# Patient Record
Sex: Male | Born: 1970 | Race: White | Hispanic: No | Marital: Married | Smoking: Current every day smoker
Health system: Southern US, Community
[De-identification: ages and names within clinical notes are randomized; demographics above are authoritative.]

## PROBLEM LIST (undated history)

## (undated) HISTORY — PX: BACK SURGERY: SHX140

---

## 2003-08-20 ENCOUNTER — Emergency Department (HOSPITAL_COMMUNITY): Admission: EM | Admit: 2003-08-20 | Discharge: 2003-08-20 | Payer: Self-pay | Admitting: Emergency Medicine

## 2012-08-14 ENCOUNTER — Other Ambulatory Visit: Payer: Self-pay | Admitting: Family Medicine

## 2012-08-14 DIAGNOSIS — M25512 Pain in left shoulder: Secondary | ICD-10-CM

## 2012-08-19 ENCOUNTER — Ambulatory Visit
Admission: RE | Admit: 2012-08-19 | Discharge: 2012-08-19 | Disposition: A | Discharge status: Home / Self Care | Payer: Worker's Compensation | Source: Ambulatory Visit | Attending: Family Medicine | Admitting: Family Medicine

## 2012-08-19 DIAGNOSIS — M25512 Pain in left shoulder: Secondary | ICD-10-CM

## 2012-09-15 ENCOUNTER — Other Ambulatory Visit: Payer: Self-pay | Admitting: *Deleted

## 2012-09-15 ENCOUNTER — Other Ambulatory Visit: Payer: Self-pay | Admitting: Orthopedic Surgery

## 2012-09-15 DIAGNOSIS — M25512 Pain in left shoulder: Secondary | ICD-10-CM

## 2012-09-22 ENCOUNTER — Ambulatory Visit
Admission: RE | Admit: 2012-09-22 | Discharge: 2012-09-22 | Disposition: A | Discharge status: Home / Self Care | Payer: Worker's Compensation | Source: Ambulatory Visit | Attending: *Deleted | Admitting: *Deleted

## 2012-09-22 DIAGNOSIS — M25512 Pain in left shoulder: Secondary | ICD-10-CM

## 2012-09-22 MED ORDER — IOHEXOL 180 MG/ML  SOLN
15.0000 mL | Freq: Once | INTRAMUSCULAR | Status: AC | PRN
  Administered 2012-09-22: 13 mL via INTRA_ARTICULAR

## 2015-06-05 ENCOUNTER — Encounter (HOSPITAL_COMMUNITY): Payer: Self-pay | Admitting: *Deleted

## 2015-06-05 ENCOUNTER — Emergency Department (HOSPITAL_COMMUNITY)
Admission: EM | Admit: 2015-06-05 | Discharge: 2015-06-05 | Disposition: A | Discharge status: Home / Self Care | Payer: Worker's Compensation | Attending: Emergency Medicine | Admitting: Emergency Medicine

## 2015-06-05 ENCOUNTER — Emergency Department (HOSPITAL_COMMUNITY): Payer: Worker's Compensation

## 2015-06-05 DIAGNOSIS — M542 Cervicalgia: Secondary | ICD-10-CM | POA: Diagnosis present

## 2015-06-05 DIAGNOSIS — M5020 Other cervical disc displacement, unspecified cervical region: Secondary | ICD-10-CM | POA: Diagnosis not present

## 2015-06-05 DIAGNOSIS — F17210 Nicotine dependence, cigarettes, uncomplicated: Secondary | ICD-10-CM | POA: Insufficient documentation

## 2015-06-05 MED ORDER — OXYCODONE-ACETAMINOPHEN 5-325 MG PO TABS
ORAL_TABLET | ORAL | Status: AC
  Filled 2015-06-05: qty 1

## 2015-06-05 MED ORDER — OXYCODONE-ACETAMINOPHEN 5-325 MG PO TABS: 2.0000 | ORAL_TABLET | ORAL | Status: AC | PRN

## 2015-06-05 MED ORDER — HYDROMORPHONE HCL 1 MG/ML IJ SOLN
1.0000 mg | Freq: Once | INTRAMUSCULAR | Status: AC
  Administered 2015-06-05: 1 mg via INTRAMUSCULAR
  Filled 2015-06-05: qty 1

## 2015-06-05 MED ORDER — OXYCODONE-ACETAMINOPHEN 5-325 MG PO TABS
1.0000 | ORAL_TABLET | Freq: Once | ORAL | Status: AC
  Administered 2015-06-05: 1 via ORAL
  Filled 2015-06-05: qty 1

## 2015-06-05 MED ORDER — OXYCODONE-ACETAMINOPHEN 5-325 MG PO TABS
1.0000 | ORAL_TABLET | Freq: Once | ORAL | Status: AC
  Administered 2015-06-05: 1 via ORAL

## 2015-06-05 NOTE — Discharge Instructions (Signed)
Herniated Disk With Rehab Between each vertebrae of the spine exists a disk. These disks contain a jelly-like material that helps cushion the spinal column. Occasionally, damage to the supportive ligaments of the vertebrae causes a disk to shift from its normal alignment and place pressure on surrounding structures, such as the spinal cord. This is called a herniated (ruptured) disk. SYMPTOMS   Pain in the back, that often affects one side.  Pain that gets worse with movement, sneezing, coughing, or straining.  Muscle spasms in the back.  Pain, numbness, or weakness affecting one arm or leg (depending on whether injury is in the neck or low back).  Muscle loss (if the condition has become chronic).  Loss of stool (bowel) or urine (bladder) function. CAUSES  Herniated disks result when a disk becomes weak. The disk eventually ruptures and places pressure on the spinal cord. Herniated disks may occur from sudden injury (acute trauma) such as heavy labor, or from ongoing (chronic) stress, such as obesity.  RISK INCREASES WITH:  Sports that involve downward or twisting pressure on the neck or spine (football, weightlifting, horseback riding competition, bowling, tennis, jogging, track, racquetball, gymnastics).  Poor strength and flexibility.  Failure to warm up properly before activity.  Family history of low back pain or disk disorders.  Previous back surgery (especially fusion).  Preexisting forward displacement of a vertebra (spondylolisthesis).  Poor technique when lifting.  Prolonged sitting, especially with poor posture. PREVENTION  Learn and use proper technique when sitting or lifting.  Warm up and stretch properly before activity.  Maintain physical fitness:  Strength, flexibility, and endurance.  Cardiovascular fitness.  Maintain a healthy body weight.  If previously injured, avoid any intense physical activity that requires twisting of the body under  uncontrollable conditions. PROGNOSIS  If treated properly, herniated disks are usually curable within 6 weeks. Sometimes, surgery is required.  RELATED COMPLICATIONS   Permanent numbness, weakness, or paralysis and muscle loss.  Chronic back pain.  Loss of bowel or bladder function.  Decreased sexual function.  Risks of surgery: infection, bleeding, injury to nerves (persistent or increased numbness, weakness, or paralysis), persistent back pain, and spinal headache. TREATMENT Treatment first involves resting from any aggravating activities and the use of ice and medicine to reduce pain and inflammation. As muscle spasms begin to decrease, it is important to perform strengthening and stretching exercises of the back muscles. These will help teach and reinforce proper body posture. These exercises may be performed at home, or with a therapist. A therapist may complete a further evaluation and recommend additional treatments, such as ultrasound, traction (for herniated disks of the neck), a cervical collar (for herniated disks of the neck), or a corset or back brace (for herniated disks of the low back). Prolonged rest may do more harm than good. Your therapist will teach you proper techniques for performing simple activities, such as lifting an object off the floor or using proper posture while sitting. At night, it is advised that you sleep on your back, on a firm mattress, and place a pillow under your knees. Your caregiver may recommend oral steroids or an injection of corticosteroids in the space around the spinal cord (epidural space) in order to reduce pain and inflammation. For severe cases, surgery is recommended. MEDICATION   If pain medicine is needed, nonsteroidal anti-inflammatory medicines (aspirin and ibuprofen), or other minor pain relievers (acetaminophen), are often advised.  Do not take pain medicine for 7 days before surgery.  Prescription pain relievers  may be given if your  caregiver thinks they are needed. Use only as directed and only as much as you need.  Ointments applied to the skin may be helpful.  Corticosteroid injections may be given. These injections should be reserved for the most serious cases, as they can only be given a certain number of times.  Oral steroids may be given to reduce inflammation, although not usually for severe (acute) injuries. HEAT AND COLD  Cold treatment (icing) relieves pain and reduces inflammation. Cold treatment should be applied for 10 to 15 minutes every 2 to 3 hours, and immediately after activity that aggravates your symptoms. Use ice packs or an ice massage.  Heat treatment may be used before performing stretching and strengthening activities prescribed by your caregiver, physical therapist, or athletic trainer. Use a heat pack or a warm water soak. SEEK MEDICAL CARE IF:   Symptoms get worse or do not improve in 2 to 4 weeks, despite treatment.  You develop loss of bowel or bladder function.  New, unexplained symptoms develop. (Drugs used in treatment may produce side effects.) EXERCISES  RANGE OF MOTION (ROM) AND STRETCHING EXERCISES - Herniated Disk (Ruptured Disk) Most people with low back pain will find that their symptoms get worse with excessive bending forward (flexion) or arching at the low back (extension). The exercises that will help resolve your symptoms will focus on the opposite motion. Your physician, physical therapist or athletic trainer will help you determine which exercises will be most helpful to resolve your low back pain. Do not complete any exercises without first consulting with your caregiver. Discontinue any exercises that make your symptoms worse, until you speak to your caregiver. If you have pain, numbness or tingling that travels down into your buttocks, leg or foot, the goal of this therapy is for these symptoms to move closer to your back and to eventually go away. Sometimes, these leg  symptoms will get better, but your low back pain may get worse. This is typically an indication of progress in your rehabilitation. Be sure to be very alert to any changes in your symptoms and to the activities you have done in the 24 hours prior to the change. Sharing this information with your caregiver will allow him or her to best treat your condition. These exercises may help you when beginning to rehabilitate your injury. Your symptoms may go away with or without further involvement from your physician, physical therapist or athletic trainer. While completing these exercises, remember:   Restoring tissue flexibility helps normal motion to return to the joints. This allows healthier, less painful movement and activity.  An effective stretch should be held for at least 30 seconds.  A stretch should never be painful. You should only feel a gentle lengthening or release in the stretched tissue. FLEXION RANGE OF MOTION AND STRETCHING EXERCISES: STRETCH - Flexion, Single Knee to Chest  Lie on a firm bed or floor, with both legs extended in front of you.  Keeping one leg in contact with the floor, bring your opposite knee to your chest. Hold your leg in place by either grabbing behind your thigh or at your knee.  Pull until you feel a gentle stretch in your low back. Hold for __________ seconds.  Slowly release your grasp and repeat the exercise with the opposite side. Repeat __________ times. Complete this exercise __________ times per day.  STRETCH - Flexion, Double Knee to Chest   Lie on a firm bed or floor, with both  legs extended in front of you.  Keeping one leg in contact with the floor, bring your opposite knee to your chest.  Tense your stomach muscles to support your back and then lift your other knee to your chest. Hold your legs in place by either grabbing behind your thighs or at your knees.  Pull both knees toward your chest until you feel a gentle stretch in your low back.  Hold for __________ seconds.  Tense your stomach muscles and slowly return one leg at a time to the floor. Repeat __________ times. Complete this exercise __________ times per day.  STRETCH - Low Trunk Rotation  Lie on a firm bed or floor. Keeping your legs in front of you, bend your knees so they are both pointed toward the ceiling and your feet are flat on the floor.  Extend your arms out to the side. This will stabilize your upper body by keeping your shoulders in contact with the floor.  Gently and slowly drop both knees together to one side, until you feel a gentle stretch in your low back. Hold for __________ seconds.  Tense your stomach muscles to support your low back as you bring your knees back to the starting position. Repeat the exercise while dropping both knees to the other side. Repeat __________ times. Complete this exercise __________ times per day  EXTENSION RANGE OF MOTION AND FLEXIBILITY EXERCISES: STRETCH - Extension, Prone on Elbows   Lie on your stomach on the floor. (A bed will be too soft.) Place your palms about shoulder width apart.  Place your elbows under your shoulders. If this is too painful, stack pillows under your chest.  Allow your body to relax so that your hips drop lower and make contact more completely with the floor.  Hold this position for __________ seconds.  Slowly return to lying flat on the floor. Repeat __________ times. Complete this exercise __________ times per day.  RANGE OF MOTION - Extension, Prone Press Ups   Lie on your stomach on the floor. (A bed will be too soft.) Place your palms about shoulder width apart and at the height of your head.  Keeping your back as relaxed as possible, slowly straighten your elbows while keeping your hips on the floor. You may adjust the placement of your hands to maximize your comfort. As you gain motion, your hands will come more underneath your shoulders.  Hold this position for __________  seconds.  Slowly return to lying flat on the floor. Repeat __________ times. Complete this exercise __________ times per day.  RANGE OF MOTION- Quadruped, Neutral Spine   Assume a hands and knees position on a firm surface. Keep your hands under your shoulders and your knees under your hips. You may place padding under your knees for comfort.  Drop your head and point your tail bone toward the ground below you. This will round out your low back like an angry cat. Hold this position for __________ seconds.  Slowly lift your head and release your tail bone so that your back sags into a large arch, like an old horse.  Hold this position for __________ seconds.  Repeat this until you feel limber in your low back.  Now, find your "sweet spot." This will be the most comfortable position somewhere between the two previous positions. This is your neutral spine. Once you have found this position, tense your stomach muscles to support your low back.  Hold this position for __________ seconds. Repeat __________ times.  Complete this exercise __________ times per day.  STRENGTHENING EXERCISES - Herniated Disk (Ruptured Disk) These exercises may help you when beginning to rehabilitate your injury. These exercises should be done near your "sweet spot." This is the neutral, low-back arch, somewhere between fully rounded and fully arched, that is your least painful position. When performed in this safe range of motion, these exercises can be used for people who have either a flexion or extension based injury. These exercises may resolve your symptoms with or without further involvement from your physician, physical therapist or athletic trainer. While completing these exercises, remember:   Muscles can gain both the endurance and the strength needed for everyday activities through controlled exercises.  Complete these exercises as instructed by your physician, physical therapist or athletic trainer. Increase  the resistance and repetitions only as guided.  You may experience muscle soreness or fatigue, but the pain or discomfort you are trying to eliminate should never worsen during these exercises. If this pain does get worse, stop and make sure you are following the directions exactly. If the pain is still present after adjustments, discontinue the exercise until you can discuss the trouble with your clinician. STRENGTHENING - Deep Abdominals, Pelvic Tilt   Lie on a firm bed or floor. Keeping your legs in front of you, bend your knees so they are both pointed toward the ceiling and your feet are flat on the floor.  Tense your lower abdominal muscles to press your low back into the floor. This motion will rotate your pelvis so that your tail bone is scooping upwards rather than pointing at your feet or into the floor.  With a gentle tension and even breathing, hold this position for __________ seconds. Repeat __________ times. Complete this exercise __________ times per day.  STRENGTHENING - Abdominals, Crunches   Lie on a firm bed or floor. Keeping your legs in front of you, bend your knees so they are both pointed toward the ceiling and your feet are flat on the floor. Cross your arms over your chest.  Slightly tip your chin down without bending your neck.  Tense your abdominals and slowly lift your trunk high enough so that your shoulder blades are just off the floor. Lifting higher can put too much stress on the low back and does not further strengthen your abdominal muscles.  With control, return to the starting position. Repeat __________ times. Complete this exercise __________ times per day.  STRENGTHENING - Quadruped, Opposite UE/LE Lift  Assume a hands and knees position on a firm surface. Keep your hands under your shoulders and your knees under your hips. You may place padding under your knees for comfort.  Find your neutral spine and gently tense your abdominal muscles so that you  can maintain this position. Your shoulders and hips should form a rectangle that is parallel with the floor and is not twisted.  Keeping your trunk steady, lift your right hand no higher than your shoulder. Then lift your left leg no higher than your hip. Make sure you are not holding your breath. Hold this position for __________ seconds.  Continuing to keep your abdominal muscles tense and your back steady, slowly return to your starting position. Repeat with the opposite arm and leg. Repeat __________ times. Complete this exercise __________ times per day.  STRENGTHENING - Lower Abdominals, Double Knee Lift  Lie on a firm bed or floor. Keeping your legs in front of you, bend your knees so they are both pointed  toward the ceiling and your feet are flat on the floor.  Tense your abdominal muscles to brace your low back and slowly lift both of your knees until they come over your hips. Be certain not to hold your breath.  Hold for __________ seconds. Using your abdominal muscles, return to the starting position in a slow and controlled manner. Repeat __________ times. Complete this exercise __________ times per day.  POSTURE AND BODY MECHANICS CONSIDERATIONS - Herniated Disc (Ruptured Disk) Keeping correct posture when sitting, standing or completing your activities will reduce the stress put on different body tissues, allowing injured tissues a chance to heal and limiting painful experiences. The following are general guidelines for improved posture. Your physician or physical therapist will provide you with any instructions specific to your needs. While reading these guidelines, remember:  The exercises prescribed by your provider will help you build the flexibility and strength to maintain correct postures.  The correct posture provides the best environment for your joints to work. All of your joints have less wear and tear when properly supported by a spine with good posture. This means you  will experience a healthier, less painful body.  Correct posture must be practiced with all of your activities, especially prolonged sitting and standing. Correct posture is as important when doing repetitive low-stress activities (typing) as it is when doing a single heavy-load activity (lifting). RESTING POSITIONS Consider which positions are most painful for you when choosing a resting position. If you have pain with flexion-based activities (sitting, bending, stooping, squatting), choose a position that allows you to rest in a less flexed posture. You would want to avoid curling into a fetal position on your side. If your pain gets worse with extension-based activities (prolonged standing, working overhead), avoid resting in an extended position such as sleeping on your stomach. Most people will find more comfort when they rest with their spine in a more neutral position, neither too rounded nor too arched. Lying on a non-sagging bed on your side, with a pillow between your knees, or on your back with a pillow under your knees will often provide some relief. Keep in mind, being in any one position for a prolonged period of time, no matter how correct your posture, can still lead to stiffness. PROPER SITTING POSTURE In order to minimize stress and discomfort on your spine, you must sit with correct posture. Sitting with good posture should be effortless for a healthy body. Returning to good posture is a gradual process. Many people can work toward this most comfortably by using various supports until they have the flexibility and strength to maintain this posture on their own. When sitting with proper posture, your ears will fall over your shoulders and your shoulders will fall over your hips. You should use the back of the chair to support your upper back. Your low back will be in a neutral position, just slightly arched. You may place a small pillow or folded towel at the base of your low back for  support.  When working at a desk, create an environment that supports good, upright posture. Without extra support, muscles tire, which leads to excessive strain on joints and other tissues. Keep these recommendations in mind. CHAIR  A chair should be able to slide under your desk when your back makes contact with the back of the chair. This allows you to work closely.  The chair's height should allow your eyes to be level with the upper part of your monitor  and your hands to be slightly lower than your elbows. BODY POSITION  Your feet should make contact with the floor. If this is not possible, use a foot rest.  Keep your ears over your shoulders. This will reduce stress on your neck and low back. INCORRECT SITTING POSTURES  If you are feeling tired and unable to assume a healthy sitting posture, do not slouch or slump. This puts excessive strain on your back tissues, causing more damage and pain. Healthier options include:  Using more support, like a lumbar pillow.  Switching tasks, to something that requires you to be upright or walking.  Talking a brief walk.  Lying down to rest in a neutral-spine position. PROLONGED STANDING WHILE SLIGHTLY LEANING FORWARD  When completing a task that requires you to lean forward while standing in one place for a long time, place either foot up on a stationary 2-4 inch high object, to help maintain the best posture. When both feet are on the ground, the low back tends to lose its slight inward curve. If this curve flattens (or becomes too large), the back and your other joints will experience too much stress, tire more quickly and can cause pain. CORRECT STANDING POSTURES Proper standing posture should be assumed with all daily activities, even if they only take a few moments, like when brushing your teeth. As in sitting, your ears should fall over your shoulders and your shoulders should fall over your hips. You should keep a slight tension in your  abdominal muscles to brace your spine. Your tailbone should point down to the ground, not behind your body, resulting in an over-extended swayback posture.  INCORRECT STANDING POSTURES  Common incorrect standing postures include a forward head, locked knees or an excessive swayback. WALKING Walk with an upright posture. Your ears, shoulders and hips should all line-up. PROLONGED ACTIVITY IN A FLEXED POSITION When completing a task that requires you to bend forward at your waist or lean over a low surface, try finding a way to stabilize 3 out of 4 of your limbs. You can place a hand or elbow on your thigh, or rest a knee on the surface you are reaching across. This will provide you more stability so that your muscles do not tire as quickly. By keeping your knees relaxed, or slightly bent, you will also reduce stress across your low back. CORRECT LIFTING TECHNIQUES DO :   Assume a wide stance. This will provide you more stability and the opportunity to get as close as possible to the object you are lifting.  Tense your abdominals to brace your spine. Then, bend at the knees and hips. Keeping your back locked in a neutral-spine position, lift using your leg muscles. Lift with your legs, keeping your back straight.  Test the weight of unknown objects before attempting to lift them.  Try to keep your elbows down by your sides, in order get the best strength from your shoulders when carrying an object.  Always ask for help when lifting heavy or awkward objects. INCORRECT LIFTING TECHNIQUES DO NOT:   Lock your knees when lifting, even if it is a small object.  Bend and twist. Pivot at your feet or move your feet when needing to change directions.  Assume that you can safely pick up even a paper clip, without proper posture.   This information is not intended to replace advice given to you by your health care provider. Make sure you discuss any questions you have with your  health care provider.    Document Released: 07/01/2005 Document Revised: 07/22/2014 Document Reviewed: 10/13/2008 [employer] Interactive Patient Education 2016 [employer]  Herniated Disk A herniated disk occurs when a disk in your spine bulges out too far. This condition is also called a ruptured disk or slipped disk. Your spine (backbone) is made up of bones called vertebrae. Between each pair of vertebrae is an oval disk with a soft, spongy center that acts as a shock absorber when you move. The spongy center is surrounded by a tough outer ring. When you have a herniated disk, the spongy center of the disk bulges out or ruptures through the outer ring. A herniated disk can press on a nerve between your vertebrae and cause pain. A herniated disk can occur anywhere in your back or neck area, but the lower back is the most common spot. CAUSES  In many cases, a herniated disk occurs just from getting older. As you age, the spongy insides of your disks tend to shrink and dry out. A herniated disk can result from gradual wear and tear. Injury or sudden strain can also cause a herniated disk.  RISK FACTORS Aging is the main risk factor for a herniated disk. Other risk factors include:  Being a man between the ages of 3930 and 4951 years.  Having a job that requires heavy lifting, bending, or twisting.  Having a job that requires long hours of driving.  Not getting enough exercise.  Being overweight.  Smoking. SIGNS AND SYMPTOMS  Signs and symptoms depend on which disk is herniated.  For a herniated disk in the lower back, you may have sharp pain in:  One part of your leg, hip, or buttocks.  The back of your calf.  The top or sole of your foot (sciatica).   For a herniated disk in the neck, you may feel pain:  When you move your neck.  Near or over your shoulder blade.  That moves to your upper arm, forearm, or fingers.   You may also have muscle weakness. It may be hard to:  Lift your leg or  arm.  Stand on your toes.  Squeeze tightly with one of your hands.  Other symptoms can include:  Numbness or tingling in the affected areas of your body.  Loss of bladder or bowel control. This is a rare but serious sign of a severe herniated disk in the lower back. DIAGNOSIS  Your health care provider will do a physical exam. During this exam, you may have to move certain body parts or assume various positions. For example, your health care provider may do the straight-leg test. This is a good way to test for a herniated disk in your lower back. In this test, the health care provider lifts your leg while you lie on your back. This is to see if you feel pain down your leg. Your health care provider will also check for numbness or loss of feeling.  Your health care provider will also check your:  Reflexes.  Muscle strength.  Posture.  Other tests may be done to help in making a diagnosis. These may include:  An X-ray of the spine to rule out other causes of back pain.   Other imaging studies, such as an MRI or CT scan. This is to check whether the herniated disk is pressing on your spinal canal.  Electromyography (EMG). This test checks the nerves that control muscles. It is sometimes used to identify the specific area of  nerve involvement.  TREATMENT  In many cases, herniated disk symptoms go away over a period of days or weeks. You will most likely be free of symptoms in 3-4 months. Treatment may include the following:  The initial treatment for a herniated disk is ashort period of rest.  Bed rest is often limited to 1 or 2 days. Resting for too long delays recovery.  If you have a herniated disk in your lower back, you should avoid sitting as much as possible because sitting increases pressure on the disk.  Medicines. These may include:   Nonsteroidal anti-inflammatory drugs (NSAIDs).  Muscle relaxants for back spasms.  Narcotic pain medicine if your pain is very  bad.   Steroid injections. You may need these along the involved nerve root to help control pain. The steroid is injected in the area of the herniated disk. It helps by reducing swelling around the disk.  Physical therapy. This may include exercises to strengthen the muscles that help support your spine.   You may need surgery if other treatments do not work.  HOME CARE INSTRUCTIONS Follow all your health care provider's instructions. These may include:  Take all medicines as directed by your health care provider.  Rest for 2 days and then start moving.  Do not sit or stand for long periods of time.  Maintain good posture when sitting and standing.  Avoid movements that cause pain, such as bending or lifting.  When you are able to start lifting things again:  [place] with your knees.  Keep your back straight.  Hold heavy objects close to your body.  If you are overweight, ask your health care provider to help you start a weight-loss program.  When you are able to start exercising, ask your health care provider how much and what type of exercise is best for you.  Work with a physical therapist on stretching and strengthening exercises for your back.  Do not wear high-heeled shoes.  Do not sleep on your belly.  Do not smoke.  Keep all follow-up visits as directed by your health care provider. SEEK MEDICAL CARE IF:  You have back or neck pain that is not getting better after 4 weeks.  You have very bad pain in your back or neck.  You develop numbness, tingling, or weakness along with pain. SEEK IMMEDIATE MEDICAL CARE IF:   You have numbness, tingling, or weakness that makes you unable to use your arms or legs.  You lose control of your bladder or bowels.  You have dizziness or fainting.  You have shortness of breath.  MAKE SURE YOU:   Understand these instructions.  Will watch your condition.  Will get help right away if you are not doing well or get  worse.   This information is not intended to replace advice given to you by your health care provider. Make sure you discuss any questions you have with your health care provider.   Keep appointment with your neurosurgeon for tomorrow at 10:45 AM. That as needed for pain. Do not drive while taking Percocet. Return to the emergency department if you ask parents worsening of your symptoms, increased pain, blurry vision, increased weakness in your left arm, discoloration of your arm.

## 2015-06-05 NOTE — ED Notes (Addendum)
Pt up to restroom. Pt able to ambulate independently, gait steady and even.  Pt able to toilet independently.

## 2015-06-05 NOTE — ED Notes (Signed)
Pt back from MRI. Placed back on the monitor.

## 2015-06-05 NOTE — ED Notes (Signed)
Pt reports hx of cervical fusion. Was picking up object last night and felt "something pop in his neck" now has severe pain to upper back, neck and reports numbness to left arm and right hand.

## 2015-06-05 NOTE — ED Notes (Signed)
Pt able to ambulate independently to wheelchair 

## 2015-06-05 NOTE — ED Notes (Signed)
Spoke with PA.  Okay'd pt to eat and drink.  [place] and beverages provided to pt and his guests.

## 2015-06-07 ENCOUNTER — Encounter (HOSPITAL_COMMUNITY): Payer: Self-pay | Admitting: Emergency Medicine

## 2015-06-07 ENCOUNTER — Emergency Department (HOSPITAL_COMMUNITY)
Admission: EM | Admit: 2015-06-07 | Discharge: 2015-06-07 | Disposition: A | Discharge status: Home / Self Care | Payer: Worker's Compensation | Attending: Emergency Medicine | Admitting: Emergency Medicine

## 2015-06-07 DIAGNOSIS — F17210 Nicotine dependence, cigarettes, uncomplicated: Secondary | ICD-10-CM | POA: Insufficient documentation

## 2015-06-07 DIAGNOSIS — M5020 Other cervical disc displacement, unspecified cervical region: Secondary | ICD-10-CM | POA: Diagnosis not present

## 2015-06-07 DIAGNOSIS — M542 Cervicalgia: Secondary | ICD-10-CM | POA: Insufficient documentation

## 2015-06-07 MED ORDER — MORPHINE SULFATE (PF) 4 MG/ML IV SOLN
4.0000 mg | INTRAVENOUS | Status: DC | PRN
  Administered 2015-06-07: 4 mg via INTRAVENOUS
  Filled 2015-06-07: qty 1

## 2015-06-07 MED ORDER — DEXAMETHASONE SODIUM PHOSPHATE 10 MG/ML IJ SOLN
10.0000 mg | Freq: Once | INTRAMUSCULAR | Status: AC
  Administered 2015-06-07: 10 mg via INTRAVENOUS
  Filled 2015-06-07: qty 1

## 2015-06-07 MED ORDER — LORAZEPAM 2 MG/ML IJ SOLN
1.0000 mg | Freq: Once | INTRAMUSCULAR | Status: AC
  Administered 2015-06-07: 1 mg via INTRAVENOUS
  Filled 2015-06-07: qty 1

## 2015-06-07 MED ORDER — ONDANSETRON 4 MG PO TBDP: 4.0000 mg | ORAL_TABLET | Freq: Three times a day (TID) | ORAL | Status: AC | PRN

## 2015-06-07 MED ORDER — KETOROLAC TROMETHAMINE 30 MG/ML IJ SOLN: 30.0000 mg | Freq: Once | INTRAMUSCULAR | Status: DC

## 2015-06-07 MED ORDER — MORPHINE SULFATE (PF) 4 MG/ML IV SOLN
4.0000 mg | Freq: Once | INTRAVENOUS | Status: AC
  Administered 2015-06-07: 4 mg via INTRAVENOUS
  Filled 2015-06-07: qty 1

## 2015-06-07 MED ORDER — FENTANYL 25 MCG/HR TD PT72
25.0000 ug | MEDICATED_PATCH | Freq: Once | TRANSDERMAL | Status: DC
  Administered 2015-06-07: 25 ug via TRANSDERMAL
  Filled 2015-06-07: qty 1

## 2015-06-07 NOTE — ED Provider Notes (Signed)
CSN: [id number]     Arrival date & time 06/07/15  1538 History   First MD Initiated Contact with Patient 06/07/15 1552     Chief Complaint  Patient presents with  . Neck Pain   (Consider location/radiation/quality/duration/timing/severity/associated sxs/prior Treatment) HPI Comments: He reports sudden onset of neck pain 2 days ago while bending to pick something up. He was seen in the emergency Department that time an MRI showed mild disc bulging. He was treated with pain medicine and referred to his neurosurgeon. He was seen by his neurosurgeon yesterday who discussed his MRI findings and did not think this was a surgical case at this time. He was given steroid Dosepak and pain medications as well as Valium. He reports the pain has continued to get worse today, and he caught his neurosurgeon who advised him to come to the emergency department for pain relief. He started the steroids this morning and took only 2 percocet prior to his arrival. He reports numbness tingling and burning radiating into both arms. He thinks his left arm is weaker than his right.   Patient is a 44 y.o. male presenting with neck pain.  Neck Pain Pain location:  Generalized neck Quality:  Burning and stabbing Pain radiates to:  L shoulder, R shoulder, R scapula, L scapula, L arm, R arm, L forearm, R forearm, R hand and L hand Pain severity:  Severe Pain is:  Same all the time Onset quality:  Sudden Duration:  2 days Timing:  Constant Progression:  Worsening Chronicity:  New Context: lifting a heavy object and recent injury   Context: not fall   Relieved by:  Nothing Worsened by:  Position Ineffective treatments:  Analgesics and muscle relaxants Associated symptoms: tingling and weakness   Associated symptoms: no bladder incontinence, no bowel incontinence, no chest pain, no fever, no headaches, no leg pain, no photophobia, no syncope and no weight loss   Risk factors comment:  Left arm   History reviewed. No  pertinent past medical history. Past Surgical History  Procedure Laterality Date  . Back surgery     No family history on file. Social History  Substance Use Topics  . Smoking status: Current Every Day Smoker    Types: Cigarettes  . Smokeless tobacco: None  . Alcohol Use: No    Review of Systems  Constitutional: Negative for fever, chills, weight loss, activity change, appetite change and fatigue.  Eyes: Negative for photophobia.  Respiratory: Negative.   Cardiovascular: Negative.  Negative for chest pain and syncope.  Gastrointestinal: Negative for bowel incontinence.  Genitourinary: Negative for bladder incontinence.  Musculoskeletal: Positive for neck pain.  Neurological: Positive for tingling and weakness. Negative for headaches.      Allergies  Review of patient's allergies indicates no known allergies.  Home Medications   Prior to Admission medications   Medication Sig Start Date End Date Taking? Authorizing Provider  oxyCODONE-acetaminophen (PERCOCET/ROXICET) 5-325 MG tablet Take 2 tablets by mouth every 4 (four) hours as needed for severe pain. 06/05/15  Yes Samantha Tripp [place], PA-C  ondansetron (ZOFRAN ODT) 4 MG disintegrating tablet Take 1 tablet (4 mg total) by mouth every 8 (eight) hours as needed for nausea or vomiting. 06/07/15   Jamal Collin, MD   BP 143/92 mmHg  Pulse 67  Temp(Src) 97.7 F (36.5 C) (Oral)  SpO2 96% Physical Exam  Constitutional: He is oriented to person, place, and time. He appears well-developed and well-nourished. No distress.  Eyes: Conjunctivae are normal. Pupils are  equal, round, and reactive to light.  Neck:  Tenderness to palpation in paraspinal muscles without midline tenderness. ROM limited by pain.   Cardiovascular: Normal rate, regular rhythm, normal heart sounds and intact distal pulses.   No murmur heard. Pulmonary/Chest: Effort normal and breath sounds normal.  Neurological: He is alert and oriented to person,  place, and time. No cranial nerve deficit.  Reflex Scores:      Tricep reflexes are 1+ on the right side and 1+ on the left side.      Bicep reflexes are 1+ on the right side and 1+ on the left side. Shoulder abduction, elbow flexion and extension, wrist flexion extension , finger abduction & adduction all 5/5 strength. Reports tingling in bilateral arms but no numbness  Nursing note and vitals reviewed.   ED Course  Procedures (including critical care time) Labs Review Labs Reviewed - No data to display  Imaging Review No results found. I have personally reviewed and evaluated these images and lab results as part of my medical decision-making.   EKG Interpretation None      MDM   Final diagnoses:  Neck pain  Cervical disc herniation   44 year old male present with neck pain with radiation to bilateral arms. 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 He was seen in the ED 2 days ago with similar symptoms and MRI that revealed mild disc bulging at C5. He was seen by his neurosurgeon yesterday and was advised that he did not need surgery; he was given steroid dose pack, pain medication and Valium which he was unable to fill until this morning. He called his neurosurgeon who advised him to come to the emergency department for pain control. I Called and discussed his case and MRI with his neurosurgeon who IV pain medications and steroids. His pain was improved with IV medications and he was discharged to follow-up with his neurosurgeon on Monday.   Jamal CollinJames R Deanta Mincey, MD 06/07/2015, 7:01 PM PGY-3, [facility]    Jamal CollinJames R Francine Hannan, MD 06/07/15 1901  Nelva Nayobert Beaton, MD 06/07/15 [phone]

## 2015-06-07 NOTE — ED Provider Notes (Signed)
CSN: [id number]     Arrival date & time 06/05/15  1155 History   First MD Initiated Contact with Patient 06/05/15 1600     Chief Complaint  Patient presents with  . Neck Pain     (Consider location/radiation/quality/duration/timing/severity/associated sxs/prior Treatment) HPI   Ralph Mcdowell is a 44 y.o M with a pmhx of a C5-C6 disc fusion 05/2014 who presents to the ED c/o neck pain. Pt states that last night he was picking up one of his child's toys when he felt sudden onset pain in his neck with associated paresthesias and weakness in his left arm. Pt has taken hydrocodone with no relief. Denies blurry vision, jaw or chest pain, numbness, dizziness, syncope.   History reviewed. No pertinent past medical history. Past Surgical History  Procedure Laterality Date  . Back surgery     History reviewed. No pertinent family history. Social History  Substance Use Topics  . Smoking status: Current Every Day Smoker    Types: Cigarettes  . Smokeless tobacco: None  . Alcohol Use: No    Review of Systems  Neurological: Negative for tremors and headaches.  All other systems reviewed and are negative.     Allergies  Review of patient's allergies indicates no known allergies.  Home Medications   Prior to Admission medications   Medication Sig Start Date End Date Taking? Authorizing Provider  ondansetron (ZOFRAN ODT) 4 MG disintegrating tablet Take 1 tablet (4 mg total) by mouth every 8 (eight) hours as needed for nausea or vomiting. 06/07/15   Jamal CollinJames R Joyner, MD  oxyCODONE-acetaminophen (PERCOCET/ROXICET) 5-325 MG tablet Take 2 tablets by mouth every 4 (four) hours as needed for severe pain. 06/05/15   Nicki Gracy Tripp Shamyah Stantz, PA-C   BP 126/93 mmHg  Pulse 72  Temp(Src) 97.7 F (36.5 C) (Oral)  Resp 18  SpO2 98% Physical Exam  Constitutional: He is oriented to person, place, and time. He appears well-developed and well-nourished. No distress.  HENT:  Head: Normocephalic and  atraumatic.  Mouth/Throat: No oropharyngeal exudate.  Eyes: Conjunctivae and EOM are normal. Pupils are equal, round, and reactive to light. Right eye exhibits no discharge. Left eye exhibits no discharge. No scleral icterus.  Cardiovascular: Normal rate, regular rhythm, normal heart sounds and intact distal pulses.  Exam reveals no gallop and no friction rub.   No murmur heard. Pulmonary/Chest: Effort normal and breath sounds normal. No respiratory distress. He has no wheezes. He has no rales. He exhibits no tenderness.  Abdominal: Soft. He exhibits no distension. There is no tenderness. There is no guarding.  Musculoskeletal: Normal range of motion. He exhibits no edema.  Cervical midline spinal tenderness present  Neurological: He is alert and oriented to person, place, and time. No cranial nerve deficit. He exhibits normal muscle tone.  Grip Strength 3/5 in LUE. Strength 5/5 in all other extremties. No sensory deficits.    Skin: Skin is warm and dry. No rash noted. He is not diaphoretic. No erythema. No pallor.  Psychiatric: He has a normal mood and affect. His behavior is normal.  Nursing note and vitals reviewed.   ED Course  Procedures (including critical care time) Labs Review Labs Reviewed - No data to display  Imaging Review No results found. I have personally reviewed and evaluated these images and lab results as part of my medical decision-making.   EKG Interpretation None      MDM   Final diagnoses:  Cervical disc herniation    44 y.o with hx  of C5-C6 disc fusion presents for acute onset neck pain with associated left arm paresthesias and weakness after picking up something heavy last night. Concern for new disc herniation. Will obtain MRI to evaluate. Pt given pain medication in ED>  MRI reveals new C3-4 mild bulge. Mild narrowing ventral aspect of the thecal sac. This is clinically correlated with pts symptoms. Pt has already scheduled appointment with  neurosurgeon tomorrow at 10:45 am. Will d/c home with pain medications. Feel that pt is stable for discharge with close follow up. Pt is hemodynamically stable. Pain controlled in ED. Return precautions outlined in patient discharge instructions.      Lester Kinsman [place], PA-C 06/07/15 2228  Arby Barrette, MD 06/12/15 [phone]

## 2015-06-07 NOTE — Discharge Instructions (Signed)
Herniated Disk  A herniated disk occurs when a disk in your spine bulges out too far. This condition is also called a ruptured disk or slipped disk. Your spine (backbone) is made up of bones called vertebrae. Between each pair of vertebrae is an oval disk with a soft, spongy center that acts as a shock absorber when you move. The spongy center is surrounded by a tough outer ring.  When you have a herniated disk, the spongy center of the disk bulges out or ruptures through the outer ring. A herniated disk can press on a nerve between your vertebrae and cause pain. A herniated disk can occur anywhere in your back or neck area, but the lower back is the most common spot.  CAUSES   In many cases, a herniated disk occurs just from getting older. As you age, the spongy insides of your disks tend to shrink and dry out. A herniated disk can result from gradual wear and tear. Injury or sudden strain can also cause a herniated disk.   RISK FACTORS  Aging is the main risk factor for a herniated disk. Other risk factors include:   Being a man between the ages of 30 and 50 years.   Having a job that requires heavy lifting, bending, or twisting.   Having a job that requires long hours of driving.   Not getting enough exercise.   Being overweight.   Smoking.  SIGNS AND SYMPTOMS   Signs and symptoms depend on which disk is herniated.   For a herniated disk in the lower back, you may have sharp pain in:    One part of your leg, hip, or buttocks.    The back of your calf.    The top or sole of your foot (sciatica).    For a herniated disk in the neck, you may feel pain:    When you move your neck.    Near or over your shoulder blade.    That moves to your upper arm, forearm, or fingers.    You may also have muscle weakness. It may be hard to:    Lift your leg or arm.    Stand on your toes.    Squeeze tightly with one of your hands.   Other symptoms can include:    Numbness or tingling in the affected areas of your body.     Loss of bladder or bowel control. This is a rare but serious sign of a severe herniated disk in the lower back.  DIAGNOSIS   Your health care provider will do a physical exam. During this exam, you may have to move certain body parts or assume various positions. For example, your health care provider may do the straight-leg test. This is a good way to test for a herniated disk in your lower back. In this test, the health care provider lifts your leg while you lie on your back. This is to see if you feel pain down your leg. Your health care provider will also check for numbness or loss of feeling.   Your health care provider will also check your:   Reflexes.   Muscle strength.   Posture.   Other tests may be done to help in making a diagnosis. These may include:   An X-ray of the spine to rule out other causes of back pain.    Other imaging studies, such as an MRI or CT scan. This is to check whether the herniated disk is   pressing on your spinal canal.   Electromyography (EMG). This test checks the nerves that control muscles. It is sometimes used to identify the specific area of nerve involvement.   TREATMENT   In many cases, herniated disk symptoms go away over a period of days or weeks. You will most likely be free of symptoms in 3-4 months. Treatment may include the following:   The initial treatment for a herniated disk is ashort period of rest.    Bed rest is often limited to 1 or 2 days. Resting for too long delays recovery.    If you have a herniated disk in your lower back, you should avoid sitting as much as possible because sitting increases pressure on the disk.   Medicines. These may include:     Nonsteroidal anti-inflammatory drugs (NSAIDs).    Muscle relaxants for back spasms.    Narcotic pain medicine if your pain is very bad.    Steroid injections. You may need these along the involved nerve root to help control pain. The steroid is injected in the area of the herniated disk. It  helps by reducing swelling around the disk.   Physical therapy. This may include exercises to strengthen the muscles that help support your spine.    You may need surgery if other treatments do not work.   HOME CARE INSTRUCTIONS  Follow all your health care provider's instructions. These may include:   Take all medicines as directed by your health care provider.   Rest for 2 days and then start moving.   Do not sit or stand for long periods of time.   Maintain good posture when sitting and standing.   Avoid movements that cause pain, such as bending or lifting.   When you are able to start lifting things again:   Bend with your knees.   Keep your back straight.   Hold heavy objects close to your body.   If you are overweight, ask your health care provider to help you start a weight-loss program.   When you are able to start exercising, ask your health care provider how much and what type of exercise is best for you.   Work with a physical therapist on stretching and strengthening exercises for your back.   Do not wear high-heeled shoes.   Do not sleep on your belly.   Do not smoke.   Keep all follow-up visits as directed by your health care provider.  SEEK MEDICAL CARE IF:   You have back or neck pain that is not getting better after 4 weeks.   You have very bad pain in your back or neck.   You develop numbness, tingling, or weakness along with pain.  SEEK IMMEDIATE MEDICAL CARE IF:    You have numbness, tingling, or weakness that makes you unable to use your arms or legs.   You lose control of your bladder or bowels.   You have dizziness or fainting.   You have shortness of breath.   MAKE SURE YOU:    Understand these instructions.   Will watch your condition.   Will get help right away if you are not doing well or get worse.     This information is not intended to replace advice given to you by your health care provider. Make sure you discuss any questions you have with your health  care provider.     Document Released: 06/28/2000 Document Revised: 07/22/2014 Document Reviewed: 06/04/2013  [employer]vier Interactive Patient Education   2016 [employer]

## 2015-06-07 NOTE — ED Notes (Signed)
Per  [facility] patient was seen Monday for neck pain and was diagnosed with a bulging disc at C3.  Patient states the pain has gradually gotten worse since being seen.  En route patient received 250mcg Fentanyl, 4mg  zofran, and 30mg  toradol.  Patient currently rates pain at 6/10 and is A/Ox4.

## 2016-10-28 ENCOUNTER — Emergency Department (HOSPITAL_COMMUNITY)
Admission: EM | Admit: 2016-10-28 | Discharge: 2016-10-28 | Disposition: A | Discharge status: Home / Self Care | Payer: Commercial Managed Care - PPO | Attending: Emergency Medicine | Admitting: Emergency Medicine

## 2016-10-28 ENCOUNTER — Encounter (HOSPITAL_COMMUNITY): Payer: Self-pay | Admitting: Emergency Medicine

## 2016-10-28 ENCOUNTER — Emergency Department (HOSPITAL_COMMUNITY): Payer: Commercial Managed Care - PPO

## 2016-10-28 DIAGNOSIS — F17210 Nicotine dependence, cigarettes, uncomplicated: Secondary | ICD-10-CM | POA: Diagnosis not present

## 2016-10-28 DIAGNOSIS — R109 Unspecified abdominal pain: Secondary | ICD-10-CM | POA: Diagnosis present

## 2016-10-28 DIAGNOSIS — Z79899 Other long term (current) drug therapy: Secondary | ICD-10-CM | POA: Insufficient documentation

## 2016-10-28 LAB — URINALYSIS, ROUTINE W REFLEX MICROSCOPIC
BILIRUBIN URINE: NEGATIVE
GLUCOSE, UA: NEGATIVE mg/dL
Hgb urine dipstick: NEGATIVE
KETONES UR: NEGATIVE mg/dL
LEUKOCYTES UA: NEGATIVE
NITRITE: NEGATIVE
PROTEIN: NEGATIVE mg/dL
Specific Gravity, Urine: 1.005 (ref 1.005–1.030)
pH: 7 (ref 5.0–8.0)

## 2016-10-28 LAB — CBC WITH DIFFERENTIAL/PLATELET
BASOS ABS: 0.1 10*3/uL (ref 0.0–0.1)
BASOS PCT: 1 %
EOS ABS: 0.4 10*3/uL (ref 0.0–0.7)
Eosinophils Relative: 4 %
HEMATOCRIT: 43.6 % (ref 39.0–52.0)
Hemoglobin: 15.1 g/dL (ref 13.0–17.0)
Lymphocytes Relative: 28 %
Lymphs Abs: 2.6 10*3/uL (ref 0.7–4.0)
MCH: 31.3 pg (ref 26.0–34.0)
MCHC: 34.6 g/dL (ref 30.0–36.0)
MCV: 90.5 fL (ref 78.0–100.0)
MONO ABS: 0.7 10*3/uL (ref 0.1–1.0)
Monocytes Relative: 8 %
NEUTROS ABS: 5.5 10*3/uL (ref 1.7–7.7)
NEUTROS PCT: 59 %
Platelets: 203 10*3/uL (ref 150–400)
RBC: 4.82 MIL/uL (ref 4.22–5.81)
RDW: 13.3 % (ref 11.5–15.5)
WBC: 9.4 10*3/uL (ref 4.0–10.5)

## 2016-10-28 LAB — COMPREHENSIVE METABOLIC PANEL
ALBUMIN: 4.1 g/dL (ref 3.5–5.0)
ALT: 17 U/L (ref 17–63)
ANION GAP: 7 (ref 5–15)
AST: 20 U/L (ref 15–41)
Alkaline Phosphatase: 98 U/L (ref 38–126)
BILIRUBIN TOTAL: 0.3 mg/dL (ref 0.3–1.2)
BUN: 11 mg/dL (ref 6–20)
CHLORIDE: 106 mmol/L (ref 101–111)
CO2: 27 mmol/L (ref 22–32)
Calcium: 8.9 mg/dL (ref 8.9–10.3)
Creatinine, Ser: 0.99 mg/dL (ref 0.61–1.24)
GFR calc Af Amer: >60 mL/min (ref >60)
GFR calc non Af Amer: >60 mL/min (ref >60)
GLUCOSE: 99 mg/dL (ref 65–99)
POTASSIUM: 4 mmol/L (ref 3.5–5.1)
SODIUM: 140 mmol/L (ref 135–145)
Total Protein: 6.9 g/dL (ref 6.5–8.1)

## 2016-10-28 MED ORDER — KETOROLAC TROMETHAMINE 30 MG/ML IJ SOLN
30.0000 mg | Freq: Once | INTRAMUSCULAR | Status: AC
  Administered 2016-10-28: 30 mg via INTRAVENOUS
  Filled 2016-10-28: qty 1

## 2016-10-28 MED ORDER — TRAMADOL HCL 50 MG PO TABS: 50.0000 mg | ORAL_TABLET | Freq: Four times a day (QID) | ORAL | 0 refills | Status: AC | PRN

## 2016-10-28 MED ORDER — SODIUM CHLORIDE 0.9 % IV BOLUS (SEPSIS)
500.0000 mL | Freq: Once | INTRAVENOUS | Status: AC
  Administered 2016-10-28: 500 mL via INTRAVENOUS

## 2016-10-28 MED ORDER — ONDANSETRON HCL 4 MG/2ML IJ SOLN
4.0000 mg | Freq: Once | INTRAMUSCULAR | Status: AC
  Administered 2016-10-28: 4 mg via INTRAVENOUS
  Filled 2016-10-28: qty 2

## 2016-10-28 MED ORDER — NAPROXEN 500 MG PO TABS: 500.0000 mg | ORAL_TABLET | Freq: Two times a day (BID) | ORAL | 0 refills | Status: AC

## 2016-10-28 MED ORDER — HYDROMORPHONE HCL 2 MG/ML IJ SOLN
1.0000 mg | Freq: Once | INTRAMUSCULAR | Status: AC
  Administered 2016-10-28: 1 mg via INTRAVENOUS
  Filled 2016-10-28: qty 1

## 2016-10-28 MED ORDER — CEPHALEXIN 500 MG PO CAPS: 500.0000 mg | ORAL_CAPSULE | Freq: Four times a day (QID) | ORAL | 0 refills | Status: AC

## 2016-10-28 NOTE — Discharge Instructions (Signed)
Follow up with a family md or Dr. Annabell Howells or partners next week.

## 2016-10-28 NOTE — ED Notes (Signed)
ED Provider at bedside. 

## 2016-10-28 NOTE — ED Provider Notes (Signed)
[facility]-EMERGENCY DEPT Provider Note   CSN: [id number] Arrival date & time: 10/28/16  0865     History   Chief Complaint Chief Complaint  Patient presents with  . Flank Pain    HPI Ralph Mcdowell is a 46 y.o. male.  Patient complains of left flank pain with dysuria and hematuria  for over a week. The flank pain starting worse.   The history is provided by the patient. No language interpreter was used.  Flank Pain  This is a new problem. The current episode started more than 2 days ago. The problem occurs constantly. The problem has not changed since onset.Pertinent negatives include no chest pain, no abdominal pain and no headaches. Exacerbated by: Movement. Nothing relieves the symptoms.    History reviewed. No pertinent past medical history.  There are no active problems to display for this patient.   Past Surgical History:  Procedure Laterality Date  . BACK SURGERY         Home Medications    Prior to Admission medications   Medication Sig Start Date End Date Taking? Authorizing Provider  cephALEXin (KEFLEX) 500 MG capsule Take 1 capsule (500 mg total) by mouth 4 (four) times daily. 10/28/16   Bethann Berkshire, MD  naproxen (NAPROSYN) 500 MG tablet Take 1 tablet (500 mg total) by mouth 2 (two) times daily. 10/28/16   Bethann Berkshire, MD  ondansetron (ZOFRAN ODT) 4 MG disintegrating tablet Take 1 tablet (4 mg total) by mouth every 8 (eight) hours as needed for nausea or vomiting. 06/07/15   Jamal Collin, MD  oxyCODONE-acetaminophen (PERCOCET/ROXICET) 5-325 MG tablet Take 2 tablets by mouth every 4 (four) hours as needed for severe pain. 06/05/15   Samantha Tripp Dowless, PA-C  traMADol (ULTRAM) 50 MG tablet Take 1 tablet (50 mg total) by mouth every 6 (six) hours as needed. 10/28/16   Bethann Berkshire, MD    Family History History reviewed. No pertinent family history.  Social History Social History  Substance Use Topics  . Smoking status: Current Every Day Smoker   Types: Cigarettes  . Smokeless tobacco: Not on file  . Alcohol use No     Allergies   Patient has no known allergies.   Review of Systems Review of Systems  Constitutional: Negative for appetite change and fatigue.  HENT: Negative for congestion, ear discharge and sinus pressure.   Eyes: Negative for discharge.  Respiratory: Negative for cough.   Cardiovascular: Negative for chest pain.  Gastrointestinal: Negative for abdominal pain and diarrhea.  Genitourinary: Positive for flank pain. Negative for frequency and hematuria.  Musculoskeletal: Negative for back pain.  Skin: Negative for rash.  Neurological: Negative for seizures and headaches.  Psychiatric/Behavioral: Negative for hallucinations.     Physical Exam Updated Vital Signs BP 111/73 (BP Location: Left Arm)   Pulse (!) 52   Temp 97.4 F (36.3 C) (Oral)   Resp 18   SpO2 99%   Physical Exam  Constitutional: He is oriented to person, place, and time. He appears well-developed.  HENT:  Head: Normocephalic.  Eyes: Conjunctivae and EOM are normal. No scleral icterus.  Neck: Neck supple. No thyromegaly present.  Cardiovascular: Normal rate and regular rhythm.  Exam reveals no gallop and no friction rub.   No murmur heard. Pulmonary/Chest: No stridor. He has no wheezes. He has no rales. He exhibits no tenderness.  Abdominal: He exhibits no distension. There is no tenderness. There is no rebound.  Genitourinary:  Genitourinary Comments: Tender left flank  Musculoskeletal: Normal range of motion. He exhibits no edema.  Lymphadenopathy:    He has no cervical adenopathy.  Neurological: He is oriented to person, place, and time. He exhibits normal muscle tone. Coordination normal.  Skin: No rash noted. No erythema.  Psychiatric: He has a normal mood and affect. His behavior is normal.     ED Treatments / Results  Labs (all labs ordered are listed, but only abnormal results are displayed) Labs Reviewed    URINALYSIS, ROUTINE W REFLEX MICROSCOPIC - Abnormal; Notable for the following:       Result Value   Color, Urine STRAW (*)    All other components within normal limits  URINE CULTURE  CBC WITH DIFFERENTIAL/PLATELET  COMPREHENSIVE METABOLIC PANEL    EKG  EKG Interpretation None       Radiology Ct Renal Stone Study  Result Date: 10/28/2016 CLINICAL DATA:  Progressive left flank pain.  Hematuria and dysuria. EXAM: CT ABDOMEN AND PELVIS WITHOUT CONTRAST TECHNIQUE: Multidetector CT imaging of the abdomen and pelvis was performed following the standard protocol without IV contrast. COMPARISON:  None. FINDINGS: Lower chest: Normal. Hepatobiliary: No focal liver abnormality is seen. No gallstones, gallbladder wall thickening, or biliary dilatation. Pancreas: Unremarkable. No pancreatic ductal dilatation or surrounding inflammatory changes. Spleen: Normal in size without focal abnormality. Adrenals/Urinary Tract: Adrenal glands are unremarkable. Kidneys are normal, without renal calculi, focal lesion, or hydronephrosis. Bladder is unremarkable. Stomach/Bowel: Stomach is within normal limits. Appendix appears normal. No evidence of bowel wall thickening, distention, or inflammatory changes. Vascular/Lymphatic: No significant vascular findings are present. No enlarged abdominal or pelvic lymph nodes. Reproductive: Prostate is unremarkable. Other: No abdominal wall hernia or abnormality. No abdominopelvic ascites. Musculoskeletal: No acute or significant osseous findings. IMPRESSION: Benign-appearing abdomen and pelvis. Electronically Signed   By: Francene Boyers M.D.   On: 10/28/2016 10:57    Procedures Procedures (including critical care time)  Medications Ordered in ED Medications  HYDROmorphone (DILAUDID) injection 1 mg (1 mg Intravenous Given 10/28/16 0914)  ondansetron (ZOFRAN) injection 4 mg (4 mg Intravenous Given 10/28/16 0914)  sodium chloride 0.9 % bolus 500 mL (0 mLs Intravenous Stopped  10/28/16 0943)  ketorolac (TORADOL) 30 MG/ML injection 30 mg (30 mg Intravenous Given 10/28/16 1030)     Initial Impression / Assessment and Plan / ED Course  I have reviewed the triage vital signs and the nursing notes.  Pertinent labs & imaging results that were available during my care of the patient were reviewed by me and considered in my medical decision making (see chart for details).    Labs CT and urine are unremarkable. Patient improved with treatment. Suspect musculoskeletal problem for flank pain. Although patient does complain of dysuria and hematuria this was not seen on the urine. His urinalysis will be cultured he will be put on Keflex empirically given Naprosyn and Ultram and he is referred to either family doctor urology  Final Clinical Impressions(s) / ED Diagnoses   Final diagnoses:  Flank pain    New Prescriptions New Prescriptions   CEPHALEXIN (KEFLEX) 500 MG CAPSULE    Take 1 capsule (500 mg total) by mouth 4 (four) times daily.   NAPROXEN (NAPROSYN) 500 MG TABLET    Take 1 tablet (500 mg total) by mouth 2 (two) times daily.   TRAMADOL (ULTRAM) 50 MG TABLET    Take 1 tablet (50 mg total) by mouth every 6 (six) hours as needed.     Bethann Berkshire, MD 10/28/16 [phone]

## 2016-10-28 NOTE — ED Triage Notes (Signed)
Pt reports L flank pain for the past 2 weeks that got worse today. Has had hematuria and dysuria. No prior hx of kidney stones.

## 2016-10-28 NOTE — ED Notes (Signed)
Patient transported to CT 

## 2016-10-28 NOTE — ED Notes (Signed)
Patient returned from CT c/o pain is back.  Made Dr Estell Harpin aware. New orders given for Toradol  via IV.

## 2016-10-29 LAB — URINE CULTURE: CULTURE: NO GROWTH

## 2018-09-30 ENCOUNTER — Emergency Department (HOSPITAL_COMMUNITY): Payer: Self-pay

## 2018-09-30 ENCOUNTER — Encounter (HOSPITAL_COMMUNITY): Payer: Self-pay | Admitting: *Deleted

## 2018-09-30 ENCOUNTER — Other Ambulatory Visit: Payer: Self-pay

## 2018-09-30 ENCOUNTER — Emergency Department (HOSPITAL_COMMUNITY)
Admission: EM | Admit: 2018-09-30 | Discharge: 2018-09-30 | Disposition: A | Discharge status: Home / Self Care | Payer: Self-pay | Attending: Emergency Medicine | Admitting: Emergency Medicine

## 2018-09-30 DIAGNOSIS — Z79899 Other long term (current) drug therapy: Secondary | ICD-10-CM | POA: Insufficient documentation

## 2018-09-30 DIAGNOSIS — B349 Viral infection, unspecified: Secondary | ICD-10-CM | POA: Insufficient documentation

## 2018-09-30 DIAGNOSIS — F17210 Nicotine dependence, cigarettes, uncomplicated: Secondary | ICD-10-CM | POA: Insufficient documentation

## 2018-09-30 LAB — INFLUENZA PANEL BY PCR (TYPE A & B)
Influenza A By PCR: NEGATIVE
Influenza B By PCR: NEGATIVE

## 2018-09-30 MED ORDER — PREDNISONE 20 MG PO TABS: 40.0000 mg | ORAL_TABLET | Freq: Every day | ORAL | 0 refills | Status: AC

## 2018-09-30 MED ORDER — OSELTAMIVIR PHOSPHATE 75 MG PO CAPS: 75.0000 mg | ORAL_CAPSULE | Freq: Two times a day (BID) | ORAL | 0 refills | Status: AC

## 2018-09-30 MED ORDER — ALBUTEROL SULFATE HFA 108 (90 BASE) MCG/ACT IN AERS
1.0000 | INHALATION_SPRAY | Freq: Once | RESPIRATORY_TRACT | Status: AC
  Administered 2018-09-30: 2 via RESPIRATORY_TRACT
  Filled 2018-09-30: qty 6.7

## 2018-09-30 NOTE — ED Provider Notes (Signed)
[facility] EMERGENCY DEPARTMENT Provider Note   CSN: [id number] Arrival date & time: 09/30/18  0913   History   Chief Complaint Chief Complaint  Patient presents with  . Fever    HPI Ralph Mcdowell is a 48 y.o. male.     HPI   48 year old male presents today with complaints of fever cough and body aches.  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  Patient notes that yesterday he woke up with a sore throat body aches and fever to 103.  He notes taking ibuprofen last night.  He woke this morning still feeling bad was productive cough, he notes no significant shortness of breath, no fever this morning but still feels extremely weak.  His wife notes that she is experiencing sore throat and fatigue as well but does not have a fever.  He notes a history of smoking but denies any known history of asthma COPD or any other chronic health conditions.  He did not receive the influenza vaccine.    History reviewed. No pertinent past medical history.  There are no active problems to display for this patient.   Past Surgical History:  Procedure Laterality Date  . BACK SURGERY         Home Medications    Prior to Admission medications   Medication Sig Start Date End Date Taking? Authorizing Provider  cephALEXin (KEFLEX) 500 MG capsule Take 1 capsule (500 mg total) by mouth 4 (four) times daily. 10/28/16   Bethann Berkshire, MD  naproxen (NAPROSYN) 500 MG tablet Take 1 tablet (500 mg total) by mouth 2 (two) times daily. 10/28/16   Bethann Berkshire, MD  ondansetron (ZOFRAN ODT) 4 MG disintegrating tablet Take 1 tablet (4 mg total) by mouth every 8 (eight) hours as needed for nausea or vomiting. 06/07/15   Jamal Collin, MD  oseltamivir (TAMIFLU) 75 MG capsule Take 1 capsule (75 mg total) by mouth every 12 (twelve) hours. 09/30/18   Azan Maneri, Tinnie Gens, PA-C  oxyCODONE-acetaminophen (PERCOCET/ROXICET) 5-325  MG tablet Take 2 tablets by mouth every 4 (four) hours as needed for severe pain. 06/05/15   Dowless, Lelon Mast Tripp, PA-C  predniSONE (DELTASONE) 20 MG tablet Take 2 tablets (40 mg total) by mouth daily. 09/30/18   Kassity Woodson, Tinnie Gens, PA-C  traMADol (ULTRAM) 50 MG tablet Take 1 tablet (50 mg total) by mouth every 6 (six) hours as needed. 10/28/16   Bethann Berkshire, MD    Family History History reviewed. No pertinent family history.  Social History Social History   Tobacco Use  . Smoking status: Current Every Day Smoker    Types: Cigarettes  . Smokeless tobacco: Former Engineer, water Use Topics  . Alcohol use: No  . Drug use: No     Allergies   Patient has no known allergies.   Review of Systems Review of Systems  All other systems reviewed and are negative.    Physical Exam Updated Vital Signs BP 136/87 (BP Location: Right Arm)   Pulse 85   Temp 98.9 F (37.2 C) (Oral)   Resp 18   Ht 5\' 9"  (1.753 m)   Wt 90.7 kg   SpO2 97%   BMI 29.53 kg/m   Physical Exam Vitals signs and nursing note reviewed.  Constitutional:      Appearance: He is well-developed.  HENT:     Head: Normocephalic and atraumatic.     Comments: Oropharynx  slightly erythematous, no swelling edema or exudate Eyes:     General: No scleral icterus.       Right eye: No discharge.        Left eye: No discharge.     Conjunctiva/sclera: Conjunctivae normal.     Pupils: Pupils are equal, round, and reactive to light.  Neck:     Musculoskeletal: Normal range of motion.     Vascular: No JVD.     Trachea: No tracheal deviation.  Pulmonary:     Effort: Pulmonary effort is normal.     Breath sounds: No stridor.     Comments: Bilateral expiratory wheeze, no crackles, no respiratory distress Neurological:     Mental Status: He is alert and oriented to person, place, and time.     Coordination: Coordination normal.  Psychiatric:        Behavior: Behavior normal.        Thought Content: Thought content  normal.        Judgment: Judgment normal.     ED Treatments / Results  Labs (all labs ordered are listed, but only abnormal results are displayed) Labs Reviewed  INFLUENZA PANEL BY PCR (TYPE A & B)    EKG None  Radiology Dg Chest 2 View  Result Date: 09/30/2018 CLINICAL DATA:  Cough, shortness of breath, sore throat, fever and body aches. EXAM: CHEST - 2 VIEW COMPARISON:  02/10/2017 FINDINGS: The heart size and mediastinal contours are within normal limits. There is no evidence of pulmonary edema, consolidation, pneumothorax, nodule or pleural fluid. The visualized skeletal structures are unremarkable. IMPRESSION: No active cardiopulmonary disease. Electronically Signed   By: Irish Lack M.D.   On: 09/30/2018 09:54    Procedures Procedures (including critical care time)  Medications Ordered in ED Medications  albuterol (PROVENTIL HFA;VENTOLIN HFA) 108 (90 Base) MCG/ACT inhaler 1-2 puff (2 puffs Inhalation Given 09/30/18 0939)     Initial Impression / Assessment and Plan / ED Course  I have reviewed the triage vital signs and the nursing notes.  Pertinent labs & imaging results that were available during my care of the patient were reviewed by me and considered in my medical decision making (see chart for details).        Labs: Influenza  Imaging: DG chest 2 view  Consults:  Therapeutics:  Discharge Meds:   Assessment/Plan: 48 year old male presents today with viral illness.  Patient does have wheezing on exam there was improved with albuterol here.  He has no signs of respiratory distress and is currently afebrile with no antipyretics prior to evaluation.  Patient has a negative chest x-ray.  He does have a negative influenza test here although his symptoms are consistent with viral illness given his respiratory complaints will err on the side of caution as these tests are not 100% accurate.  Patient will be given Tamiflu.  Patient also given prednisone.  Turn for  coronavirus was discussed with the patient and his wife.  Based on [place]'s guideline for testing he does not qualify as a high risk patient and thus would not qualify for testing here in the emergent setting.  I discussed the need for self quarantine, return if any new or worsening signs or symptoms present.  Patient verbalized understanding and agreement to today's plan and had no further questions or concerns at the time of discharge.   Final Clinical Impressions(s) / ED Diagnoses   Final diagnoses:  Viral illness    ED Discharge Orders  Ordered    oseltamivir (TAMIFLU) 75 MG capsule  Every 12 hours     09/30/18 1039    predniSONE (DELTASONE) 20 MG tablet  Daily     09/30/18 1137           Eyvonne Mechanic, Cordelia Poche 09/30/18 1246    Gerhard Munch, MD 09/30/18 1510

## 2018-09-30 NOTE — Discharge Instructions (Addendum)
Please read the attached information.  Although your influenza test here is negative there is still a chance this could be influenza.  As we discussed this could also be other viral respiratory illnesses.  I would encourage you to stay at home, self quarantine yourself from other individuals until 48 hours after your symptoms have dissipated.  If you develop any new or worsening signs or symptoms return immediately to the emergency room for repeat evaluation.

## 2018-09-30 NOTE — ED Triage Notes (Signed)
Pt woke up on Tuesday  Morning feeling bad . Pt reports a 103 temp at 2300 Tuesday . Pt took  thera-flu and 3 hrs later  He took  Advil. Pt  Has no fever on arrival to Room.  Pt has a Hx of asthma .

## 2018-09-30 NOTE — ED Notes (Signed)
Patient verbalizes understanding of discharge instructions . Opportunity for questions and answers were provided . Armband removed by staff ,Pt discharged from ED. W/C  offered at D/C  and Declined W/C at D/C and was escorted to lobby by RN.  

## 2019-04-08 ENCOUNTER — Emergency Department (HOSPITAL_COMMUNITY)
Admission: EM | Admit: 2019-04-08 | Discharge: 2019-04-08 | Disposition: A | Discharge status: Home / Self Care | Payer: Self-pay | Attending: Emergency Medicine | Admitting: Emergency Medicine

## 2019-04-08 ENCOUNTER — Other Ambulatory Visit: Payer: Self-pay

## 2019-04-08 ENCOUNTER — Emergency Department (HOSPITAL_COMMUNITY): Payer: Self-pay

## 2019-04-08 DIAGNOSIS — Z5321 Procedure and treatment not carried out due to patient leaving prior to being seen by health care provider: Secondary | ICD-10-CM | POA: Insufficient documentation

## 2019-04-08 NOTE — ED Triage Notes (Signed)
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 Pt says he feel like bone against bone. Pt says tingling in his fingers. Pt says hard to move up and down or outward

## 2019-04-08 NOTE — ED Notes (Signed)
Pt asked to leave bc he said he could not sit here any loner and was going to urgent care in the am for his injury from work.

## 2019-05-21 ENCOUNTER — Other Ambulatory Visit: Payer: Self-pay

## 2019-05-21 DIAGNOSIS — Z20822 Contact with and (suspected) exposure to covid-19: Secondary | ICD-10-CM

## 2019-05-22 LAB — NOVEL CORONAVIRUS, NAA: SARS-CoV-2, NAA: NOT DETECTED

## 2020-05-04 IMAGING — CR DG SHOULDER 2+V*R*
2 series · 2 of 2 positions shown · non-contrast
Comparison: None.

CLINICAL DATA: Injury, arm pain.

EXAM:
RIGHT SHOULDER - 2+ VIEW

[shoulder grashey]
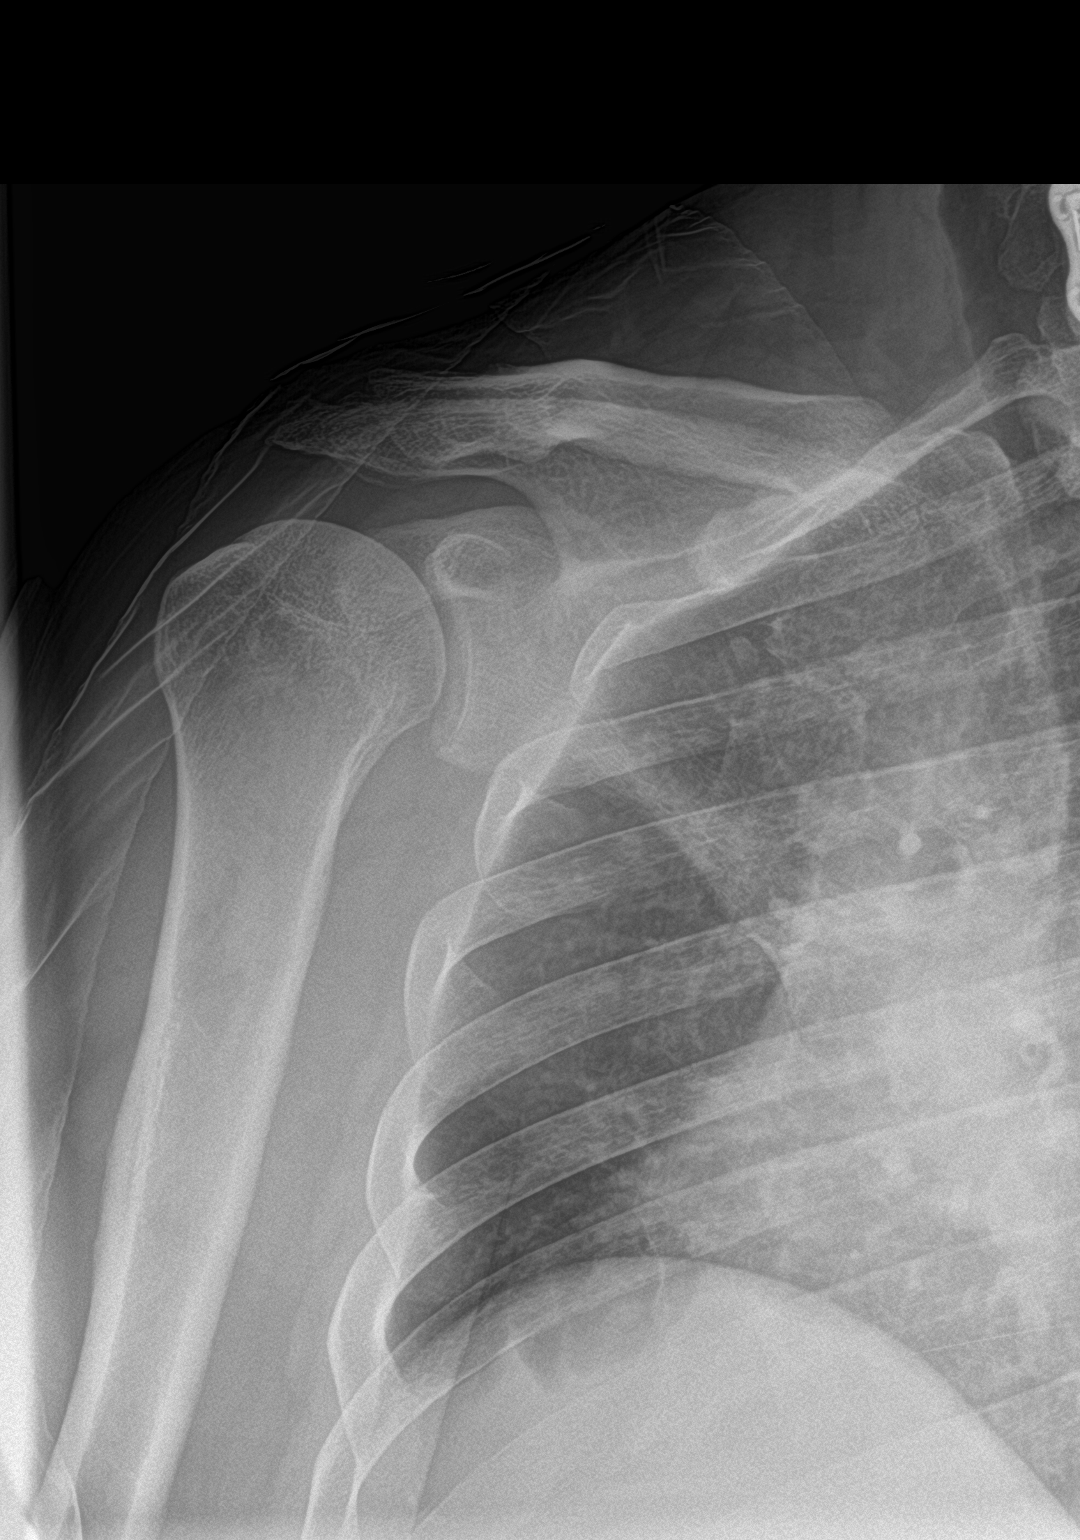

[shoulder y view]
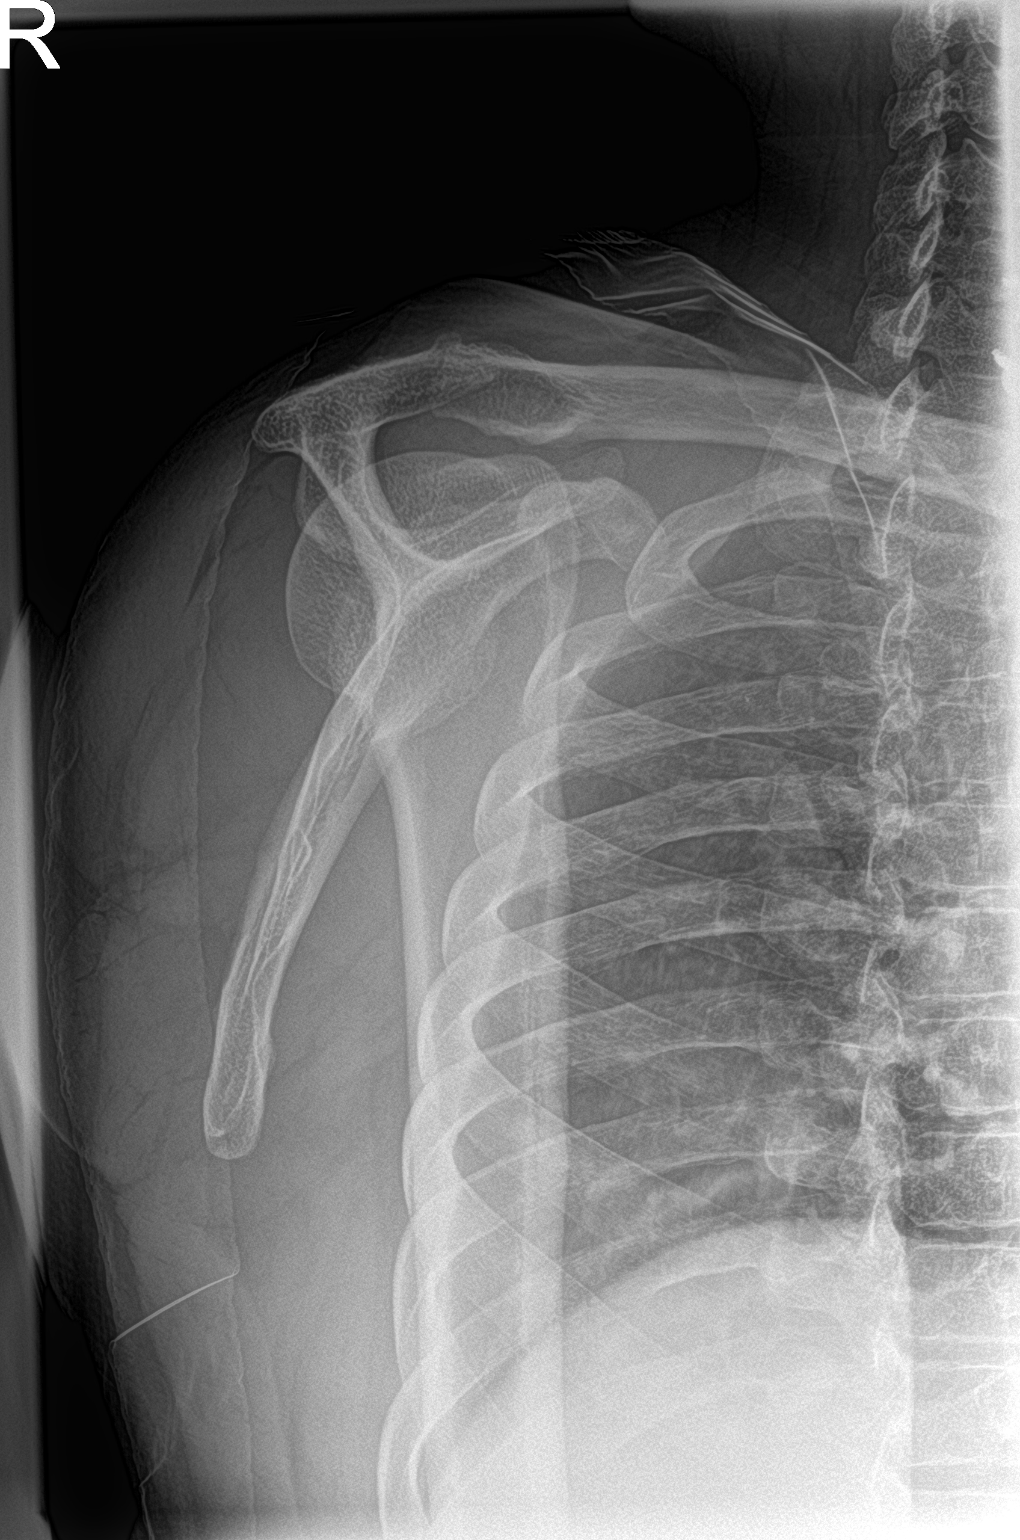

[2 of 2 positions shown; findings below may reference images not displayed]

FINDINGS: There is no evidence of fracture or dislocation. There is no
evidence of arthropathy or other focal bone abnormality. Soft
tissues are unremarkable.
IMPRESSION: Negative.
